# Patient Record
Sex: Female | Born: 1970 | Race: White | Hispanic: No | Marital: Married | Smoking: Never smoker
Health system: Southern US, Community
[De-identification: ages and names within clinical notes are randomized; demographics above are authoritative.]

## PROBLEM LIST (undated history)

## (undated) DIAGNOSIS — M1990 Unspecified osteoarthritis, unspecified site: Secondary | ICD-10-CM

## (undated) DIAGNOSIS — Z973 Presence of spectacles and contact lenses: Secondary | ICD-10-CM

---

## 1993-06-05 DIAGNOSIS — B2790 Infectious mononucleosis, unspecified without complication: Secondary | ICD-10-CM

## 1993-06-05 HISTORY — DX: Infectious mononucleosis, unspecified without complication: B27.90

## 2016-06-05 HISTORY — PX: ORIF FIBULA FRACTURE: SHX5114

## 2019-02-26 ENCOUNTER — Ambulatory Visit (INDEPENDENT_AMBULATORY_CARE_PROVIDER_SITE_OTHER): Payer: 59 | Admitting: Podiatry

## 2019-02-26 ENCOUNTER — Other Ambulatory Visit: Payer: Self-pay

## 2019-02-26 ENCOUNTER — Ambulatory Visit (INDEPENDENT_AMBULATORY_CARE_PROVIDER_SITE_OTHER): Payer: 59

## 2019-02-26 ENCOUNTER — Other Ambulatory Visit: Payer: Self-pay | Admitting: Podiatry

## 2019-02-26 ENCOUNTER — Encounter: Payer: Self-pay | Admitting: Podiatry

## 2019-02-26 VITALS — BP 102/86

## 2019-02-26 DIAGNOSIS — M84375A Stress fracture, left foot, initial encounter for fracture: Secondary | ICD-10-CM | POA: Diagnosis not present

## 2019-02-26 DIAGNOSIS — M79672 Pain in left foot: Secondary | ICD-10-CM

## 2019-02-26 MED ORDER — DICLOFENAC SODIUM 75 MG PO TBEC: 75.0000 mg | DELAYED_RELEASE_TABLET | Freq: Two times a day (BID) | ORAL | 1 refills | Status: DC

## 2019-03-02 NOTE — Progress Notes (Signed)
   HPI: 48 y.o. female presenting today as a new patient with a chief complaint of aching left dorsal foot pain that began 4-5 months ago. She states she has had a fracture in the area in the past. Applying pressure to the foot increases the pain. She has not had any treatment for her symptoms. Patient is here for further evaluation and treatment.   No past medical history on file.   Physical Exam: General: The patient is alert and oriented x3 in no acute distress.  Dermatology: Skin is warm, dry and supple bilateral lower extremities. Negative for open lesions or macerations.  Vascular: Palpable pedal pulses bilaterally. No edema or erythema noted. Capillary refill within normal limits.  Neurological: Epicritic and protective threshold grossly intact bilaterally.   Musculoskeletal Exam: Pain with palpation noted to the third metatarsal of the left foot. Range of motion within normal limits to all pedal and ankle joints bilateral. Muscle strength 5/5 in all groups bilateral.   Radiographic Exam:  Stress fracture noted to the third metatarsal of the left foot.     Assessment: 1. Stress fracture 3rd metatarsal left    Plan of Care:  1. Patient evaluated. X-Rays reviewed.  2. CAM boot dispensed. Weightbearing for four weeks.  3. Prescription for Diclofenac provided to patient. 4. Return to clinic as needed.   Euthanasia vet.      Edrick Kins, DPM [facility]  Dr. Edrick Kins, DPM    [address],  [address]                Office [phone]  Fax [phone]

## 2019-08-07 ENCOUNTER — Other Ambulatory Visit: Payer: Self-pay | Admitting: Obstetrics and Gynecology

## 2019-08-07 DIAGNOSIS — Z1231 Encounter for screening mammogram for malignant neoplasm of breast: Secondary | ICD-10-CM

## 2019-08-13 ENCOUNTER — Ambulatory Visit
Admission: RE | Admit: 2019-08-13 | Discharge: 2019-08-13 | Disposition: A | Discharge status: Home / Self Care | Payer: 59 | Source: Ambulatory Visit | Attending: Obstetrics and Gynecology | Admitting: Obstetrics and Gynecology

## 2019-08-13 ENCOUNTER — Other Ambulatory Visit: Payer: Self-pay

## 2019-08-13 DIAGNOSIS — Z1231 Encounter for screening mammogram for malignant neoplasm of breast: Secondary | ICD-10-CM

## 2020-06-05 DIAGNOSIS — E559 Vitamin D deficiency, unspecified: Secondary | ICD-10-CM

## 2020-06-05 HISTORY — DX: Vitamin D deficiency, unspecified: E55.9

## 2020-11-09 ENCOUNTER — Other Ambulatory Visit: Payer: Self-pay | Admitting: Obstetrics and Gynecology

## 2020-11-09 DIAGNOSIS — Z1231 Encounter for screening mammogram for malignant neoplasm of breast: Secondary | ICD-10-CM

## 2020-12-01 ENCOUNTER — Ambulatory Visit
Admission: RE | Admit: 2020-12-01 | Discharge: 2020-12-01 | Disposition: A | Discharge status: Home / Self Care | Payer: BC Managed Care – PPO | Source: Ambulatory Visit | Attending: Obstetrics and Gynecology | Admitting: Obstetrics and Gynecology

## 2020-12-01 ENCOUNTER — Other Ambulatory Visit: Payer: Self-pay

## 2020-12-01 DIAGNOSIS — Z1231 Encounter for screening mammogram for malignant neoplasm of breast: Secondary | ICD-10-CM | POA: Diagnosis not present

## 2020-12-01 DIAGNOSIS — Z8742 Personal history of other diseases of the female genital tract: Secondary | ICD-10-CM | POA: Diagnosis not present

## 2020-12-01 DIAGNOSIS — Z01419 Encounter for gynecological examination (general) (routine) without abnormal findings: Secondary | ICD-10-CM | POA: Diagnosis not present

## 2020-12-08 DIAGNOSIS — N852 Hypertrophy of uterus: Secondary | ICD-10-CM | POA: Diagnosis not present

## 2021-05-25 DIAGNOSIS — Z0000 Encounter for general adult medical examination without abnormal findings: Secondary | ICD-10-CM | POA: Diagnosis not present

## 2021-05-25 DIAGNOSIS — R7301 Impaired fasting glucose: Secondary | ICD-10-CM | POA: Diagnosis not present

## 2021-05-25 DIAGNOSIS — R0789 Other chest pain: Secondary | ICD-10-CM | POA: Diagnosis not present

## 2021-05-25 DIAGNOSIS — E559 Vitamin D deficiency, unspecified: Secondary | ICD-10-CM | POA: Diagnosis not present

## 2021-05-25 DIAGNOSIS — Z13220 Encounter for screening for lipoid disorders: Secondary | ICD-10-CM | POA: Diagnosis not present

## 2021-05-25 DIAGNOSIS — Z130 Encounter for screening for diseases of the blood and blood-forming organs and certain disorders involving the immune mechanism: Secondary | ICD-10-CM | POA: Diagnosis not present

## 2021-05-25 DIAGNOSIS — R002 Palpitations: Secondary | ICD-10-CM | POA: Diagnosis not present

## 2021-06-01 DIAGNOSIS — R7301 Impaired fasting glucose: Secondary | ICD-10-CM | POA: Diagnosis not present

## 2021-06-01 DIAGNOSIS — Z130 Encounter for screening for diseases of the blood and blood-forming organs and certain disorders involving the immune mechanism: Secondary | ICD-10-CM | POA: Diagnosis not present

## 2021-06-01 DIAGNOSIS — E559 Vitamin D deficiency, unspecified: Secondary | ICD-10-CM | POA: Diagnosis not present

## 2021-06-01 DIAGNOSIS — Z13220 Encounter for screening for lipoid disorders: Secondary | ICD-10-CM | POA: Diagnosis not present

## 2021-06-01 DIAGNOSIS — A084 Viral intestinal infection, unspecified: Secondary | ICD-10-CM | POA: Diagnosis not present

## 2021-06-01 DIAGNOSIS — R197 Diarrhea, unspecified: Secondary | ICD-10-CM | POA: Diagnosis not present

## 2021-06-01 DIAGNOSIS — R7303 Prediabetes: Secondary | ICD-10-CM

## 2021-06-01 HISTORY — DX: Prediabetes: R73.03

## 2021-06-05 DIAGNOSIS — R002 Palpitations: Secondary | ICD-10-CM

## 2021-06-05 HISTORY — DX: Palpitations: R00.2

## 2021-06-09 IMAGING — MG DIGITAL SCREENING BILAT W/ TOMO W/ CAD
6 of 10 series · 6 of 30 positions shown · non-contrast
Comparison: None.

CLINICAL DATA: Screening.

EXAM:
DIGITAL SCREENING BILATERAL MAMMOGRAM WITH TOMO AND CAD

[L CC synth-2D]
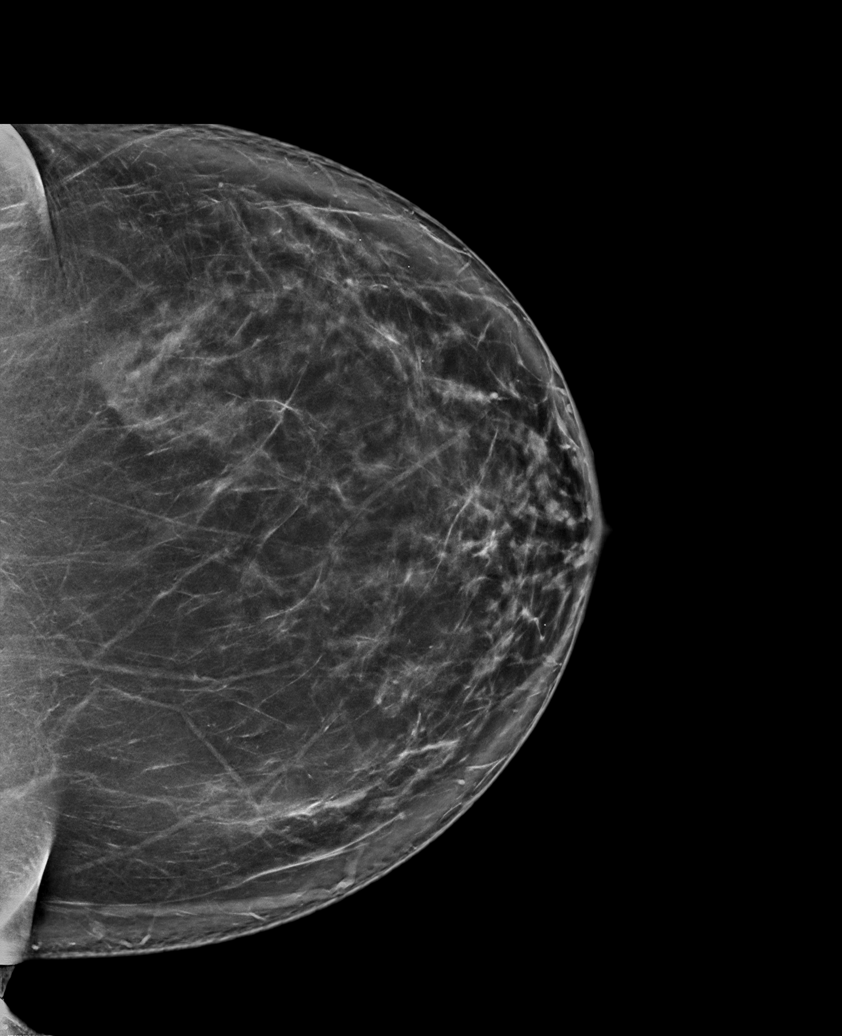

[R MLO synth-2D (1 of 2)]
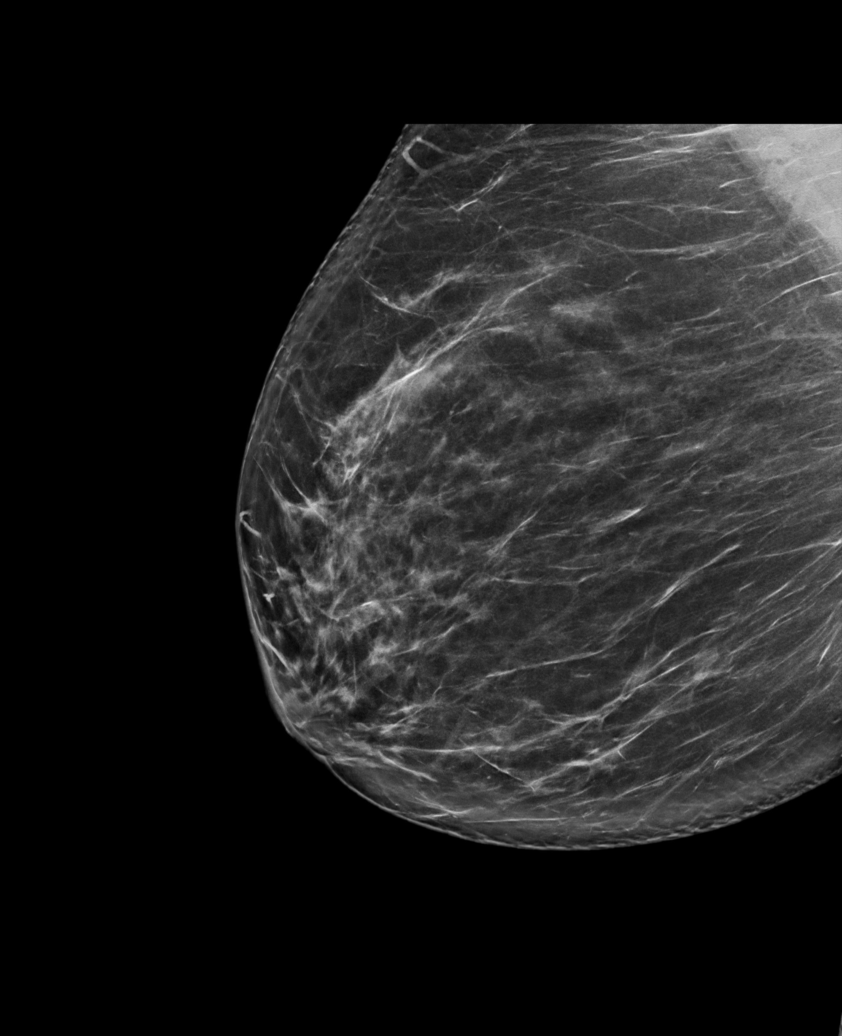

[L MLO synth-2D]
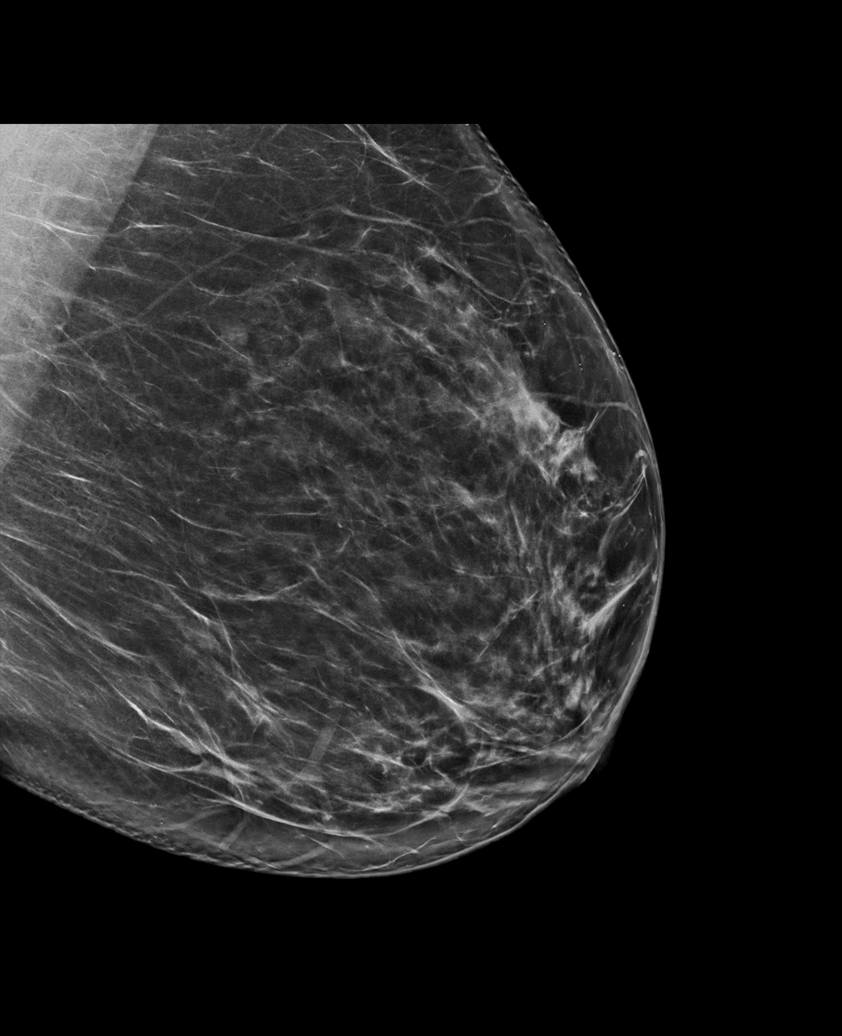

[R CC synth-2D]
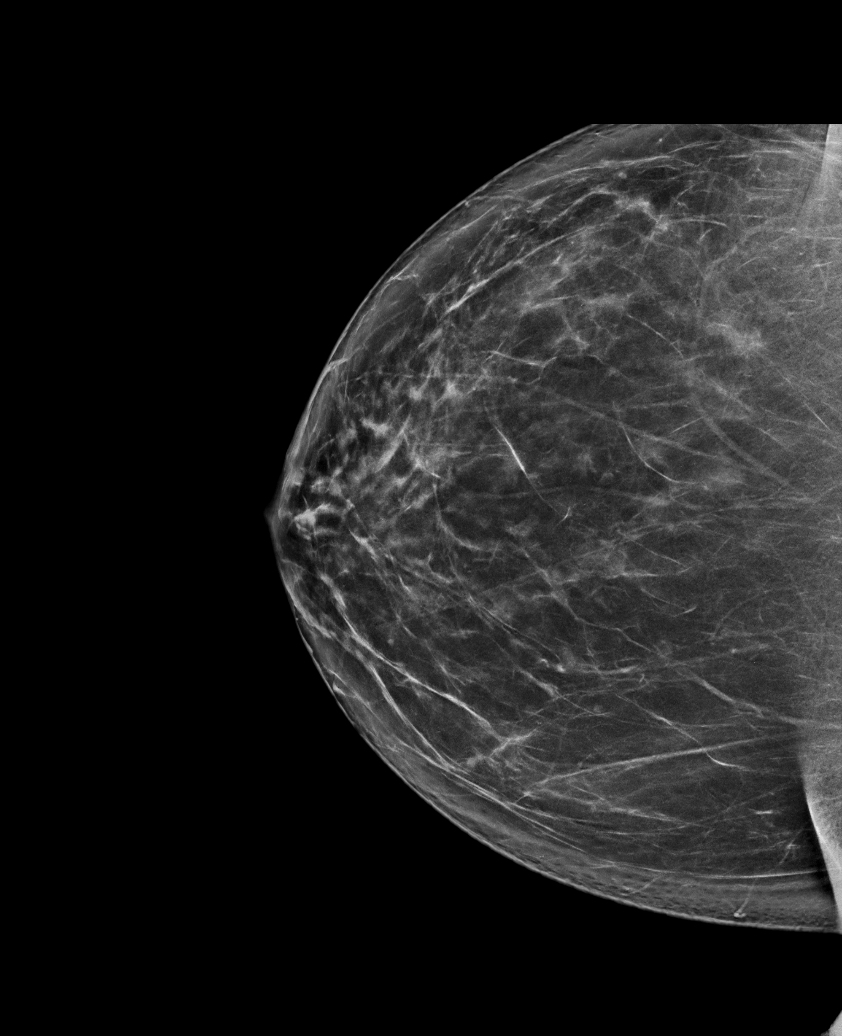

[R MLO synth-2D (2 of 2)]
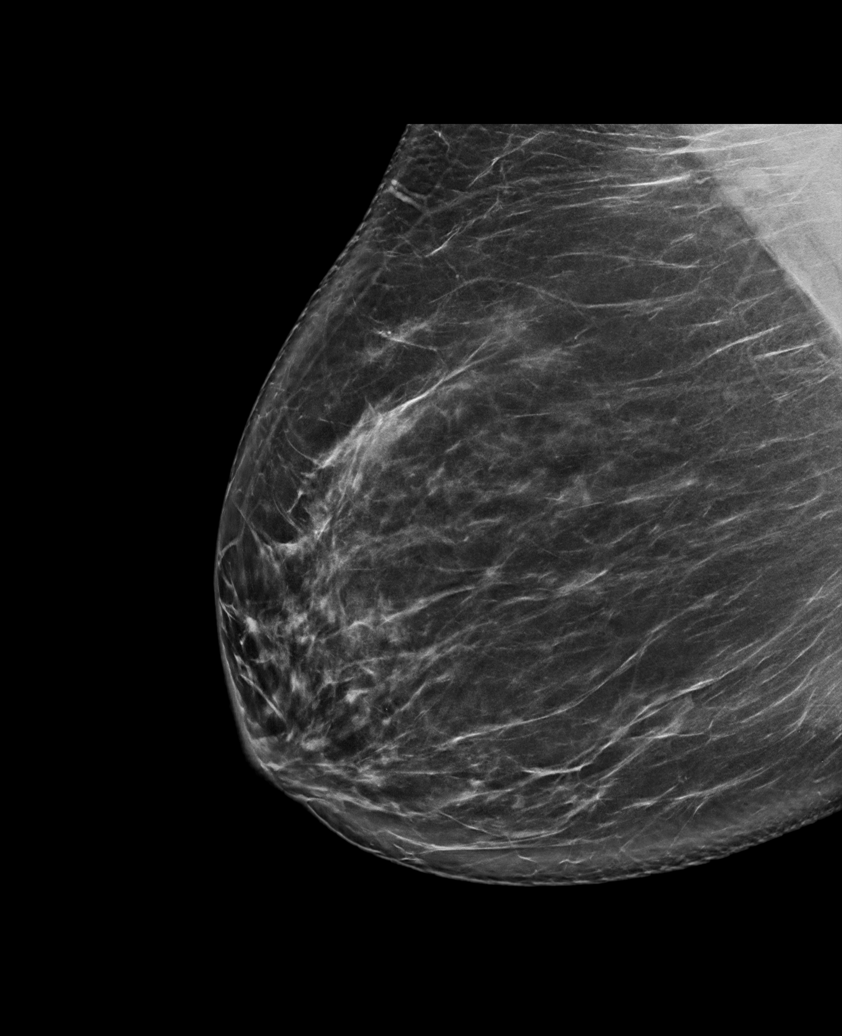

[L MLO tomo · tomo slice 45/90.0]
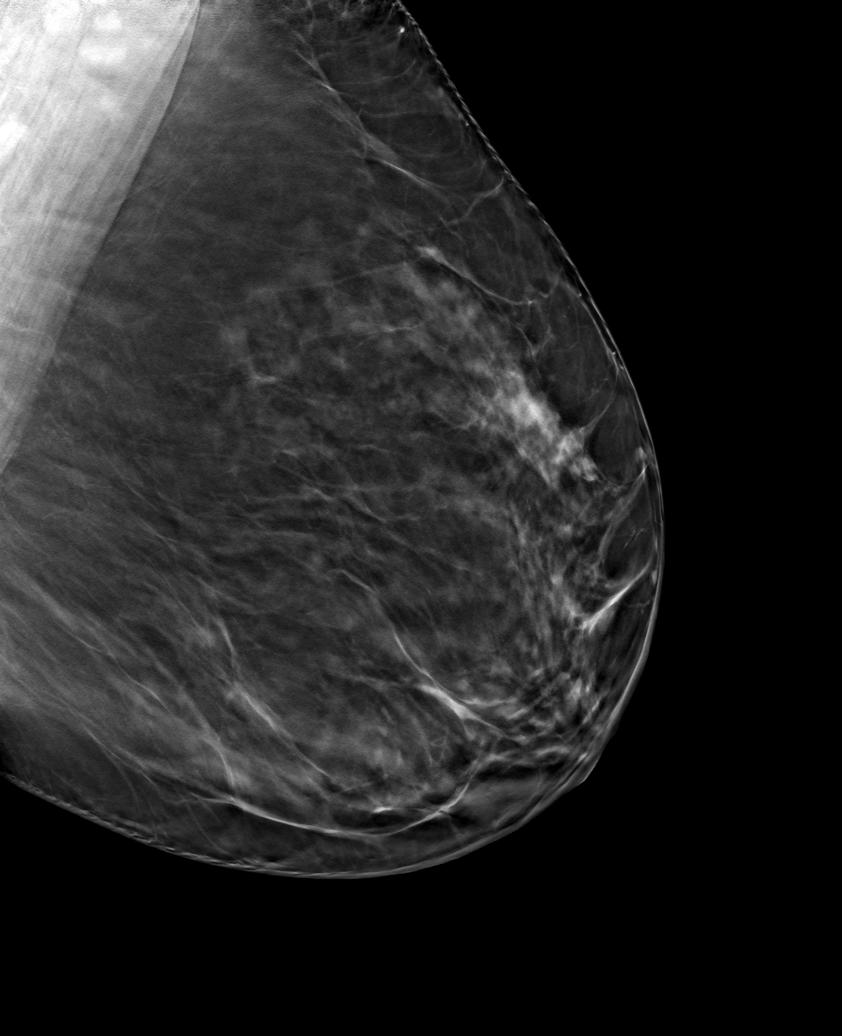

[6 of 30 positions shown; findings below may reference images not displayed]

ACR Breast Density Category b: There are scattered areas of
fibroglandular density.
FINDINGS: There are no findings suspicious for malignancy. Images were
processed with CAD.
IMPRESSION: No mammographic evidence of malignancy. A result letter of this
screening mammogram will be mailed directly to the patient.

RECOMMENDATION:
Screening mammogram in one year. (Code:[id number]-EJ6)

BI-RADS CATEGORY  1: Negative.

## 2021-06-22 DIAGNOSIS — R002 Palpitations: Secondary | ICD-10-CM | POA: Diagnosis not present

## 2021-06-22 DIAGNOSIS — R0602 Shortness of breath: Secondary | ICD-10-CM | POA: Diagnosis not present

## 2021-08-02 DIAGNOSIS — R002 Palpitations: Secondary | ICD-10-CM | POA: Diagnosis not present

## 2021-08-02 DIAGNOSIS — R0602 Shortness of breath: Secondary | ICD-10-CM | POA: Diagnosis not present

## 2021-08-17 DIAGNOSIS — E559 Vitamin D deficiency, unspecified: Secondary | ICD-10-CM | POA: Diagnosis not present

## 2021-12-08 ENCOUNTER — Other Ambulatory Visit: Payer: Self-pay | Admitting: Obstetrics and Gynecology

## 2021-12-08 DIAGNOSIS — Z1231 Encounter for screening mammogram for malignant neoplasm of breast: Secondary | ICD-10-CM

## 2021-12-14 ENCOUNTER — Ambulatory Visit
Admission: RE | Admit: 2021-12-14 | Discharge: 2021-12-14 | Disposition: A | Discharge status: Home / Self Care | Payer: BC Managed Care – PPO | Source: Ambulatory Visit | Attending: Obstetrics and Gynecology | Admitting: Obstetrics and Gynecology

## 2021-12-14 ENCOUNTER — Other Ambulatory Visit (HOSPITAL_COMMUNITY)
Admission: RE | Admit: 2021-12-14 | Discharge: 2021-12-14 | Disposition: A | Discharge status: Home / Self Care | Payer: BC Managed Care – PPO | Source: Ambulatory Visit | Attending: Nurse Practitioner | Admitting: Nurse Practitioner

## 2021-12-14 DIAGNOSIS — Z124 Encounter for screening for malignant neoplasm of cervix: Secondary | ICD-10-CM | POA: Diagnosis not present

## 2021-12-14 DIAGNOSIS — Z1231 Encounter for screening mammogram for malignant neoplasm of breast: Secondary | ICD-10-CM

## 2021-12-14 DIAGNOSIS — Z01419 Encounter for gynecological examination (general) (routine) without abnormal findings: Secondary | ICD-10-CM | POA: Diagnosis not present

## 2021-12-16 LAB — CYTOLOGY - PAP
Comment: NEGATIVE
Diagnosis: NEGATIVE
High risk HPV: NEGATIVE

## 2021-12-21 DIAGNOSIS — D259 Leiomyoma of uterus, unspecified: Secondary | ICD-10-CM | POA: Diagnosis not present

## 2022-01-16 DIAGNOSIS — N921 Excessive and frequent menstruation with irregular cycle: Secondary | ICD-10-CM | POA: Diagnosis not present

## 2022-01-16 DIAGNOSIS — D259 Leiomyoma of uterus, unspecified: Secondary | ICD-10-CM | POA: Diagnosis not present

## 2022-02-01 DIAGNOSIS — N921 Excessive and frequent menstruation with irregular cycle: Secondary | ICD-10-CM | POA: Diagnosis not present

## 2022-02-01 DIAGNOSIS — Z3202 Encounter for pregnancy test, result negative: Secondary | ICD-10-CM | POA: Diagnosis not present

## 2022-03-25 DIAGNOSIS — L03115 Cellulitis of right lower limb: Secondary | ICD-10-CM | POA: Diagnosis not present

## 2022-04-12 DIAGNOSIS — M25562 Pain in left knee: Secondary | ICD-10-CM | POA: Diagnosis not present

## 2022-04-26 DIAGNOSIS — M25562 Pain in left knee: Secondary | ICD-10-CM | POA: Diagnosis not present

## 2022-05-03 DIAGNOSIS — N921 Excessive and frequent menstruation with irregular cycle: Secondary | ICD-10-CM | POA: Diagnosis not present

## 2022-05-03 DIAGNOSIS — D259 Leiomyoma of uterus, unspecified: Secondary | ICD-10-CM | POA: Diagnosis not present

## 2022-05-03 DIAGNOSIS — M25562 Pain in left knee: Secondary | ICD-10-CM | POA: Diagnosis not present

## 2022-05-10 DIAGNOSIS — N921 Excessive and frequent menstruation with irregular cycle: Secondary | ICD-10-CM | POA: Diagnosis not present

## 2022-05-10 DIAGNOSIS — D219 Benign neoplasm of connective and other soft tissue, unspecified: Secondary | ICD-10-CM | POA: Diagnosis not present

## 2022-05-16 ENCOUNTER — Encounter (HOSPITAL_BASED_OUTPATIENT_CLINIC_OR_DEPARTMENT_OTHER): Payer: Self-pay | Admitting: Obstetrics and Gynecology

## 2022-05-17 ENCOUNTER — Encounter (HOSPITAL_BASED_OUTPATIENT_CLINIC_OR_DEPARTMENT_OTHER): Payer: Self-pay | Admitting: Obstetrics and Gynecology

## 2022-05-17 ENCOUNTER — Other Ambulatory Visit: Payer: Self-pay

## 2022-05-17 ENCOUNTER — Encounter (HOSPITAL_COMMUNITY)
Admission: RE | Admit: 2022-05-17 | Discharge: 2022-05-17 | Disposition: A | Discharge status: Home / Self Care | Payer: BC Managed Care – PPO | Source: Ambulatory Visit | Attending: Obstetrics and Gynecology | Admitting: Obstetrics and Gynecology

## 2022-05-17 DIAGNOSIS — Z01812 Encounter for preprocedural laboratory examination: Secondary | ICD-10-CM | POA: Diagnosis not present

## 2022-05-17 DIAGNOSIS — Z01818 Encounter for other preprocedural examination: Secondary | ICD-10-CM

## 2022-05-17 LAB — CBC
HCT: 45.4 % (ref 36.0–46.0)
Hemoglobin: 15.3 g/dL — ABNORMAL HIGH (ref 12.0–15.0)
MCH: 30.7 pg (ref 26.0–34.0)
MCHC: 33.7 g/dL (ref 30.0–36.0)
MCV: 91.2 fL (ref 80.0–100.0)
Platelets: 200 10*3/uL (ref 150–400)
RBC: 4.98 MIL/uL (ref 3.87–5.11)
RDW: 13.2 % (ref 11.5–15.5)
WBC: 5.7 10*3/uL (ref 4.0–10.5)
nRBC: 0 % (ref 0.0–0.2)

## 2022-05-17 NOTE — Progress Notes (Signed)
Your procedure is scheduled on Tuesday, 05/23/22.  Report to [facility]. M.   Call this number if you have problems the morning of surgery  :[phone].   OUR ADDRESS IS [address].  WE ARE LOCATED IN [facility].  PLEASE BRING YOUR INSURANCE CARD AND PHOTO ID DAY OF SURGERY.  ONLY 2 PEOPLE ARE ALLOWED IN  WAITING  ROOM.                                      REMEMBER:  DO NOT EAT FOOD, CANDY GUM OR MINTS  AFTER MIDNIGHT THE NIGHT BEFORE YOUR SURGERY . YOU MAY HAVE CLEAR LIQUIDS FROM MIDNIGHT THE NIGHT BEFORE YOUR SURGERY UNTIL  7:15 AM. NO CLEAR LIQUIDS AFTER   7:15 AM DAY OF SURGERY.  YOU MAY  BRUSH YOUR TEETH MORNING OF SURGERY AND RINSE YOUR MOUTH OUT, NO CHEWING GUM CANDY OR MINTS.     CLEAR LIQUID DIET   Foods Allowed                                                                     Foods Excluded  Coffee and tea, regular and decaf                             liquids that you cannot  Plain Jell-O                                                                   see through such as: Fruit ices (not with fruit pulp)                                     milk, soups, orange juice  Plain  Popsicles                                    All solid food Carbonated beverages, regular and diet                                    Cranberry, grape and apple juices Sports drinks like Gatorade _____________________________________________________________________     TAKE ONLY THESE MEDICATIONS MORNING OF SURGERY: Please ask your surgeon if you should take Myfembree on day of surgery. Do not take anything else (including vitamins) on morning of surgery.    UP TO 4 VISITORS  MAY VISIT IN THE EXTENDED RECOVERY ROOM UNTIL 800 PM ONLY.  ONE  VISITOR AGE 49 AND OVER MAY SPEND THE NIGHT AND MUST BE IN EXTENDED RECOVERY ROOM NO LATER THAN 800 PM . YOUR DISCHARGE TIME AFTER YOU SPEND THE  NIGHT IS 900 AM THE MORNING AFTER YOUR  SURGERY.  YOU MAY PACK A SMALL OVERNIGHT BAG WITH TOILETRIES FOR YOUR OVERNIGHT STAY IF YOU WISH.  YOUR PRESCRIPTION MEDICATIONS WILL BE PROVIDED DURING [place].                                      DO NOT WEAR JEWERLY, MAKE UP. DO NOT WEAR LOTIONS, POWDERS, PERFUMES OR NAIL POLISH ON YOUR FINGERNAILS. TOENAIL POLISH IS OK TO WEAR. DO NOT SHAVE FOR 48 HOURS PRIOR TO DAY OF SURGERY. MEN MAY SHAVE FACE AND NECK. CONTACTS, GLASSES, OR DENTURES MAY NOT BE WORN TO SURGERY.  REMEMBER: NO SMOKING, DRUGS OR ALCOHOL FOR 24 HOURS BEFORE YOUR SURGERY.                                    [place] IS NOT RESPONSIBLE  FOR ANY BELONGINGS.                                                                    Marland Kitchen           [facility] - Preparing for Surgery Before surgery, you can play an important role.  Because skin is not sterile, your skin needs to be as free of germs as possible.  You can reduce the number of germs on your skin by washing with CHG (chlorahexidine gluconate) soap before surgery.  CHG is an antiseptic cleaner which kills germs and bonds with the skin to continue killing germs even after washing. Please DO NOT use if you have an allergy to CHG or antibacterial soaps.  If your skin becomes reddened/irritated stop using the CHG and inform your nurse when you arrive at Short Stay. Do not shave (including legs and underarms) for at least 48 hours prior to the first CHG shower.  You may shave your face/neck. Please follow these instructions carefully:  1.  Shower with CHG Soap the night before surgery and the  morning of Surgery.  2.  If you choose to wash your hair, wash your hair first as usual with your  normal  shampoo.  3.  After you shampoo, rinse your hair and body thoroughly to remove the  shampoo.                                        4.  Use CHG as you would any other liquid soap.  You can apply chg directly  to the skin and wash , chg soap provided, night before and  morning of your surgery.  5.  Apply the CHG Soap to your body ONLY FROM THE NECK DOWN.   Do not use on face/ open                           Wound or open sores. Avoid contact with eyes, ears mouth and genitals (private parts).  Wash face,  Genitals (private parts) with your normal soap.             6.  Wash thoroughly, paying special attention to the area where your surgery  will be performed.  7.  Thoroughly rinse your body with warm water from the neck down.  8.  DO NOT shower/wash with your normal soap after using and rinsing off  the CHG Soap.             9.  Pat yourself dry with a clean towel.            10.  Wear clean pajamas.            11.  Place clean sheets on your bed the night of your first shower and do not  sleep with pets. Day of Surgery : Do not apply any lotions/deodorants the morning of surgery.  Please wear clean clothes to the hospital/surgery center.  IF YOU HAVE ANY SKIN IRRITATION OR PROBLEMS WITH THE SURGICAL SOAP, PLEASE GET A BAR OF GOLD DIAL SOAP AND SHOWER THE NIGHT BEFORE YOUR SURGERY AND THE MORNING OF YOUR SURGERY. PLEASE LET THE NURSE KNOW MORNING OF YOUR SURGERY IF YOU HAD ANY PROBLEMS WITH THE SURGICAL SOAP.   ________________________________________________________________________                                                        QUESTIONS [facility] PRE OP NURSE PHONE [phone].

## 2022-05-17 NOTE — Progress Notes (Signed)
Spoke w/ via phone for pre-op interview---[facility] needs dos---- urine pregnancy per anesthesia, surgeon orders pending as of 05/17/22             Lab results------05/17/22 lab appt for cbc, type & screen, 08/02/21 echocardiogram in [place] test -----patient states asymptomatic no test needed Arrive at -------0815 on Tuesday, 05/23/22 NPO after MN NO Solid Food.  Clear liquids from MN until---0715 Med rec completed Medications to take morning of surgery -----Instructed patient to ask surgeon if she should take Myfemree on day of surgery. Diabetic medication -----n/a Patient instructed no nail polish to be worn day of surgery Patient instructed to bring photo id and insurance card day of surgery Patient aware to have Driver (ride ) / caregiver    for 24 hours after surgery - husband, Dellis Filbert  Patient Special Instructions -----Extended / overnight stay instructions given. Pre-Op special Istructions -----Requested orders from Dr. Landry Mellow via [place] on 05/16/22. Patient verbalized understanding of instructions that were given at this phone interview. Patient denies shortness of breath, chest pain, fever, cough at this phone interview.

## 2022-05-22 ENCOUNTER — Other Ambulatory Visit: Payer: Self-pay | Admitting: Obstetrics and Gynecology

## 2022-05-22 DIAGNOSIS — N921 Excessive and frequent menstruation with irregular cycle: Secondary | ICD-10-CM

## 2022-05-22 NOTE — H&P (Signed)
  The note originally documented on this encounter has been moved the the encounter in which it belongs.  

## 2022-05-22 NOTE — H&P (Signed)
[facility] for Appointment 1. Preop History of Present Illness General:         51 y/o presents for preop visit. Pt is schedule for a robotic assisted laparoscopic hysterectomy with bilateral salpingoophorectomy on 05/23/2022 for the management of fibroids and menorrhagia.        Her ultrasound on 05/03/2022 reveals a uterus that is 17.3 cm x 9.8 cm x 13.2 cm. one large fibroid is noted 12 cm. It has decreased by 1 cm since her last ultrasound.        she had a benign endometrial biopsy 02/01/2022.         she started Myfembree on 01/17/2022. she is planning on definitive therapy via robotic assisted laparoscopic hysterectomy with bilateral salpingo-oophorectomy.. she desires to have the ovaries removed due to family history of ovarian cancer.  Current Medications Taking Myfembree(Relugolix-Estradiol-Norethind) 40-1-0.5 MG Tablet 1 tablet Orally Once a day Vitamin D3 50 MCG (2000 UT) Capsule 1 capsule Orally Once a day Medication List reviewed and reconciled with the patient Past Medical History      Medical History Verified. Surgical History       broken left ankle 06/2016 Family History Father: alive 52 yrs, hypercholesterolemia, colon polyps Mother: alive 66 yrs, palpatations Brother 67: alive 62 yrs, hypercholesterolemia Sister 106: alive 63 yrs, osteoporosis Paternal aunt: deceased, diagnosed with Ovarian cancer Negative family history of colon cancer and liver disease No breast or cervical cancer. Paternal aunt dx w/Breast Cancer early 66's. Social History General:  Tobacco use  cigarettes:  Never smoked, Tobacco history last updated  05/10/2022, Vaping  No. Alcohol: yes, Rare once a month. Caffeine: yes, soda 1 x daily. Recreational drug use: no. Marital Status: married, Merry Proud. Children: none. OCCUPATION: [employer]et. Gyn History Sexual activity currently sexually active.  Periods : irregular.  LMP 01/2022.  Denies H/O Birth control.  Last pap smear date 12/2021 NILM, HRHPV neg.  Last  mammogram date 12/14/2021.  H/O Abnormal pap smear 11/2019 NILM, HRHPV+.  H/O STD HPV.  Menarche 30.  OB History Never been pregnant  per patient.  Allergies Opiods: Full body Rash - Allergy Sulfamethoxazole: Full body Rash - Allergy Hospitalization/Major Diagnostic Procedure Mono, Hepatits & allergic reaction - 5 days 1995 Review of Systems CONSTITUTIONAL:         Chills No.  Fatigue No.  Fever No.  Night sweats No.  Recent travel outside [place] No.  Sweats No.  Weight change No.     OPHTHALMOLOGY:         Blurring of vision no.  Change in vision no.  Double vision no.     ENT:         Dizziness no.  Nose bleeds no.  Sore throat no.  Teeth pain no.     ALLERGY:         Hives no.     CARDIOLOGY:         Chest pain no.  High blood pressure no.  Irregular heart beat no.  Leg edema no.  Palpitations no.     RESPIRATORY:         Shortness of breath no.  Cough no.  Wheezing no.     UROLOGY:         Pain with urination no.  Urinary urgency no.  Urinary frequency no.  Urinary incontinence no.  Difficulty urinating No.  Blood in urine No.     GASTROENTEROLOGY:         Abdominal pain no.  Appetite change no.  Bloating/belching no.  Blood in stool or on toilet paper no.  Change in bowel movements no.  Constipation no.  Diarrhea no.  Difficulty swallowing no.  Nausea no.     FEMALE REPRODUCTIVE:         Vulvar pain no.  Vulvar rash no.  Abnormal vaginal bleeding , yes.  Breast pain no.  Nipple discharge no.  Pain with intercourse no.  Pelvic pain no.  Unusual vaginal discharge no.  Vaginal itching no.     MUSCULOSKELETAL:         Muscle aches no.     NEUROLOGY:         Headache no.  Tingling/numbness no.  Weakness no.     PSYCHOLOGY:         Depression no.  Anxiety no.  Nervousness no.  Sleep disturbances no.  Suicidal ideation no .     ENDOCRINOLOGY:         Excessive thirst no.  Excessive urination no.  Hair loss no.  Heat or cold intolerance no.     HEMATOLOGY/LYMPH:         Abnormal  bleeding no.  Easy bruising no.  Swollen glands no.     DERMATOLOGY:         New/changing skin lesion no.  Rash no.  Sores no.   Vital Signs Wt: 294.4, Wt change: 0.8 lbs, Ht: 68.5, BMI: 44.11, Pulse sitting: 81, BP sitting: 132/79. Examination General Examination:        CONSTITUTIONAL: alert, oriented, NAD. SKIN:  moist, warm. EYES:  Conjunctiva clear. LUNGS: good I:E efffort noted, clear to auscultation bilaterally. HEART: regular rate and rhythm. ABDOMEN: soft, non-tender/non-distended, bowel sounds present. FEMALE GENITOURINARY: normal external genitalia, labia - unremarkable, vagina - pink moist mucosa, no lesions or abnormal discharge, cervix - no discharge or lesions or CMT, adnexa - no masses or tenderness, uterus - nontender 16-18 week size uterus. EXTREMITIES: no edema present. PSYCH:  affect normal, good eye contact.  Physical Examination Chaperone present:         Chaperone present  Beather Arbour 05/10/2022 11:18:37 AM >, for pelvic exam.      Assessments 1. Fibroids - D21.9 (Primary) 2. Menorrhagia with irregular cycle - N92.1 3. Family history of ovarian cancer - Z80.41 Treatment 1. Fibroids  Notes: Notes: r/b/a of robotic assisted laparoscopic hysterectomy with bilateral salpingo-oophorectomy discussed with the patient. including but not limited to infection/ bleeding / damage to bowel bladder ureters as well as conversion to laparotomy with the need for further surgery. pt voiced understanding and would like to proceed with robotic assisted laparoscopic hysterectomy with bilateral salpingectomy.. r/o trasnfusion discussed. HIV/ HEP B and C.. she is advised to avoid NSAIDS between now and surgery. she is advised not to eat or drink after midninght the night prior to surgery.Marland Kitchen she will not be able to drive for 1 week after surgery. she will need to avoid heavy lifting over 10 lbs and sex for at least 6 weeks after surgery    2. Menorrhagia with irregular cycle  Notes: Notes: r/b/a  of robotic assisted laparoscopic hysterectomy with bilateral salpingo-oophorectomy discussed with the patient. including but not limited to infection/ bleeding / damage to bowel bladder ureters as well as conversion to laparotomy with the need for further surgery. pt voiced understanding and would like to proceed with robotic assisted laparoscopic hysterectomy with bilateral salpingectomy.. r/o trasnfusion discussed. HIV/ HEP B and C.. she is advised to avoid NSAIDS between now and surgery. she  is advised not to eat or drink after midninght the night prior to surgery.Marland Kitchen she will not be able to drive for 1 week after surgery. she will need to avoid heavy lifting over 10 lbs and sex for at least 6 weeks after surgery    3. Family history of ovarian cancer  Notes: Notes: r/b/a of robotic assisted laparoscopic hysterectomy with bilateral salpingo-oophorectomy discussed with the patient. including but not limited to infection/ bleeding / damage to bowel bladder ureters as well as conversion to laparotomy with the need for further surgery. pt voiced understanding and would like to proceed with robotic assisted laparoscopic hysterectomy with bilateral salpingectomy.. r/o trasnfusion discussed. HIV/ HEP B and C.. she is advised to avoid NSAIDS between now and surgery. she is advised not to eat or drink after midninght the night prior to surgery.Marland Kitchen she will not be able to drive for 1 week after surgery. she will need to avoid heavy lifting over 10 lbs and sex for at least 6 weeks after surgery

## 2022-05-23 ENCOUNTER — Other Ambulatory Visit: Payer: Self-pay

## 2022-05-23 ENCOUNTER — Encounter (HOSPITAL_BASED_OUTPATIENT_CLINIC_OR_DEPARTMENT_OTHER)
Admission: RE | Disposition: A | Discharge status: Home / Self Care | Payer: Self-pay | Source: Ambulatory Visit | Attending: Obstetrics and Gynecology

## 2022-05-23 ENCOUNTER — Ambulatory Visit (HOSPITAL_BASED_OUTPATIENT_CLINIC_OR_DEPARTMENT_OTHER): Payer: BC Managed Care – PPO | Admitting: Anesthesiology

## 2022-05-23 ENCOUNTER — Encounter (HOSPITAL_BASED_OUTPATIENT_CLINIC_OR_DEPARTMENT_OTHER): Payer: Self-pay | Admitting: Obstetrics and Gynecology

## 2022-05-23 ENCOUNTER — Observation Stay (HOSPITAL_BASED_OUTPATIENT_CLINIC_OR_DEPARTMENT_OTHER)
Admission: RE | Admit: 2022-05-23 | Discharge: 2022-05-23 | Disposition: A | Discharge status: Home / Self Care | Payer: BC Managed Care – PPO | Source: Ambulatory Visit | Attending: Obstetrics and Gynecology | Admitting: Obstetrics and Gynecology

## 2022-05-23 DIAGNOSIS — D259 Leiomyoma of uterus, unspecified: Secondary | ICD-10-CM | POA: Diagnosis not present

## 2022-05-23 DIAGNOSIS — N888 Other specified noninflammatory disorders of cervix uteri: Secondary | ICD-10-CM | POA: Diagnosis not present

## 2022-05-23 DIAGNOSIS — N921 Excessive and frequent menstruation with irregular cycle: Secondary | ICD-10-CM | POA: Diagnosis not present

## 2022-05-23 DIAGNOSIS — Z01818 Encounter for other preprocedural examination: Secondary | ICD-10-CM

## 2022-05-23 DIAGNOSIS — N920 Excessive and frequent menstruation with regular cycle: Secondary | ICD-10-CM | POA: Diagnosis present

## 2022-05-23 DIAGNOSIS — Z8041 Family history of malignant neoplasm of ovary: Secondary | ICD-10-CM | POA: Diagnosis not present

## 2022-05-23 HISTORY — DX: Unspecified osteoarthritis, unspecified site: M19.90

## 2022-05-23 HISTORY — DX: Presence of spectacles and contact lenses: Z97.3

## 2022-05-23 HISTORY — PX: ROBOTIC ASSISTED LAPAROSCOPIC HYSTERECTOMY AND SALPINGECTOMY: SHX6379

## 2022-05-23 LAB — POCT PREGNANCY, URINE: Preg Test, Ur: NEGATIVE

## 2022-05-23 LAB — TYPE AND SCREEN
ABO/RH(D): A POS
Antibody Screen: NEGATIVE

## 2022-05-23 LAB — ABO/RH: ABO/RH(D): A POS

## 2022-05-23 SURGERY — XI ROBOTIC ASSISTED LAPAROSCOPIC HYSTERECTOMY AND SALPINGECTOMY
Anesthesia: General | Site: Abdomen | Laterality: Bilateral

## 2022-05-23 MED ORDER — IBUPROFEN 200 MG PO TABS
ORAL_TABLET | ORAL | Status: AC
  Filled 2022-05-23: qty 3

## 2022-05-23 MED ORDER — OXYCODONE HCL 5 MG/5ML PO SOLN: 5.0000 mg | Freq: Once | ORAL | Status: DC | PRN

## 2022-05-23 MED ORDER — OXYCODONE HCL 5 MG PO TABS: 5.0000 mg | ORAL_TABLET | Freq: Once | ORAL | Status: DC | PRN

## 2022-05-23 MED ORDER — KETOROLAC TROMETHAMINE 30 MG/ML IJ SOLN
INTRAMUSCULAR | Status: AC
  Filled 2022-05-23: qty 1

## 2022-05-23 MED ORDER — AMISULPRIDE (ANTIEMETIC) 5 MG/2ML IV SOLN
10.0000 mg | Freq: Once | INTRAVENOUS | Status: AC
  Administered 2022-05-23: 10 mg via INTRAVENOUS

## 2022-05-23 MED ORDER — FENTANYL CITRATE (PF) 100 MCG/2ML IJ SOLN
INTRAMUSCULAR | Status: DC | PRN
  Administered 2022-05-23: 25 ug via INTRAVENOUS
  Administered 2022-05-23: 50 ug via INTRAVENOUS
  Administered 2022-05-23: 100 ug via INTRAVENOUS
  Administered 2022-05-23: 50 ug via INTRAVENOUS
  Administered 2022-05-23: 25 ug via INTRAVENOUS

## 2022-05-23 MED ORDER — LACTATED RINGERS IV SOLN: INTRAVENOUS | Status: DC

## 2022-05-23 MED ORDER — ACETAMINOPHEN 500 MG PO TABS: 1000.0000 mg | ORAL_TABLET | Freq: Once | ORAL | Status: DC

## 2022-05-23 MED ORDER — ACETAMINOPHEN 500 MG PO TABS
1000.0000 mg | ORAL_TABLET | ORAL | Status: AC
  Administered 2022-05-23: 1000 mg via ORAL

## 2022-05-23 MED ORDER — FENTANYL CITRATE (PF) 250 MCG/5ML IJ SOLN
INTRAMUSCULAR | Status: AC
  Filled 2022-05-23: qty 5

## 2022-05-23 MED ORDER — LIDOCAINE HCL (PF) 2 % IJ SOLN
INTRAMUSCULAR | Status: AC
  Filled 2022-05-23: qty 5

## 2022-05-23 MED ORDER — ACETAMINOPHEN 500 MG PO TABS
ORAL_TABLET | ORAL | Status: AC
  Filled 2022-05-23: qty 2

## 2022-05-23 MED ORDER — PROPOFOL 10 MG/ML IV BOLUS
INTRAVENOUS | Status: DC | PRN
  Administered 2022-05-23: 170 mg via INTRAVENOUS

## 2022-05-23 MED ORDER — ACETAMINOPHEN 500 MG PO TABS
1000.0000 mg | ORAL_TABLET | Freq: Four times a day (QID) | ORAL | Status: DC
  Administered 2022-05-23: 1000 mg via ORAL

## 2022-05-23 MED ORDER — HYDROMORPHONE HCL 2 MG PO TABS
ORAL_TABLET | ORAL | Status: AC
  Filled 2022-05-23: qty 1

## 2022-05-23 MED ORDER — SODIUM CHLORIDE 0.9 % IR SOLN
Status: DC | PRN
  Administered 2022-05-23: 1000 mL

## 2022-05-23 MED ORDER — FENTANYL CITRATE (PF) 100 MCG/2ML IJ SOLN: 25.0000 ug | INTRAMUSCULAR | Status: DC | PRN

## 2022-05-23 MED ORDER — SCOPOLAMINE 1 MG/3DAYS TD PT72: 1.0000 | MEDICATED_PATCH | TRANSDERMAL | Status: DC

## 2022-05-23 MED ORDER — PHENYLEPHRINE 80 MCG/ML (10ML) SYRINGE FOR IV PUSH (FOR BLOOD PRESSURE SUPPORT)
PREFILLED_SYRINGE | INTRAVENOUS | Status: DC | PRN
  Administered 2022-05-23: 160 ug via INTRAVENOUS

## 2022-05-23 MED ORDER — PHENYLEPHRINE 80 MCG/ML (10ML) SYRINGE FOR IV PUSH (FOR BLOOD PRESSURE SUPPORT)
PREFILLED_SYRINGE | INTRAVENOUS | Status: AC
  Filled 2022-05-23: qty 10

## 2022-05-23 MED ORDER — MIDAZOLAM HCL 5 MG/5ML IJ SOLN
INTRAMUSCULAR | Status: DC | PRN
  Administered 2022-05-23: 2 mg via INTRAVENOUS

## 2022-05-23 MED ORDER — 0.9 % SODIUM CHLORIDE (POUR BTL) OPTIME
TOPICAL | Status: DC | PRN
  Administered 2022-05-23: 500 mL

## 2022-05-23 MED ORDER — OXYCODONE HCL 5 MG PO TABS: 5.0000 mg | ORAL_TABLET | ORAL | Status: DC | PRN

## 2022-05-23 MED ORDER — GABAPENTIN 300 MG PO CAPS
ORAL_CAPSULE | ORAL | Status: AC
  Filled 2022-05-23: qty 1

## 2022-05-23 MED ORDER — SODIUM CHLORIDE (PF) 0.9 % IJ SOLN
INTRAMUSCULAR | Status: AC
  Filled 2022-05-23: qty 10

## 2022-05-23 MED ORDER — KETOROLAC TROMETHAMINE 30 MG/ML IJ SOLN: 30.0000 mg | Freq: Once | INTRAMUSCULAR | Status: DC

## 2022-05-23 MED ORDER — DEXAMETHASONE SODIUM PHOSPHATE 10 MG/ML IJ SOLN
INTRAMUSCULAR | Status: DC | PRN
  Administered 2022-05-23: 10 mg via INTRAVENOUS

## 2022-05-23 MED ORDER — SODIUM CHLORIDE 0.9 % IV SOLN
INTRAVENOUS | Status: DC | PRN
  Administered 2022-05-23: 110 mL
  Administered 2022-05-23: 10 mL

## 2022-05-23 MED ORDER — PANTOPRAZOLE SODIUM 40 MG PO TBEC
40.0000 mg | DELAYED_RELEASE_TABLET | Freq: Every day | ORAL | Status: DC
  Administered 2022-05-23: 40 mg via ORAL

## 2022-05-23 MED ORDER — ACETAMINOPHEN 500 MG PO TABS: 1000.0000 mg | ORAL_TABLET | Freq: Three times a day (TID) | ORAL | 0 refills | Status: AC | PRN

## 2022-05-23 MED ORDER — ONDANSETRON HCL 4 MG PO TABS: 4.0000 mg | ORAL_TABLET | Freq: Four times a day (QID) | ORAL | Status: DC | PRN

## 2022-05-23 MED ORDER — SENNA 8.6 MG PO TABS
ORAL_TABLET | ORAL | Status: AC
  Filled 2022-05-23: qty 1

## 2022-05-23 MED ORDER — HEMOSTATIC AGENTS (NO CHARGE) OPTIME
TOPICAL | Status: DC | PRN
  Administered 2022-05-23: 1

## 2022-05-23 MED ORDER — SODIUM CHLORIDE 0.9 % IV SOLN
INTRAVENOUS | Status: AC
  Filled 2022-05-23: qty 2

## 2022-05-23 MED ORDER — MIDAZOLAM HCL 2 MG/2ML IJ SOLN
INTRAMUSCULAR | Status: AC
  Filled 2022-05-23: qty 2

## 2022-05-23 MED ORDER — POVIDONE-IODINE 10 % EX SWAB: 2.0000 | Freq: Once | CUTANEOUS | Status: DC

## 2022-05-23 MED ORDER — FENTANYL CITRATE (PF) 100 MCG/2ML IJ SOLN: 50.0000 ug | INTRAMUSCULAR | Status: DC | PRN

## 2022-05-23 MED ORDER — SODIUM CHLORIDE 0.9 % IV SOLN
2.0000 g | INTRAVENOUS | Status: AC
  Administered 2022-05-23: 2 g via INTRAVENOUS

## 2022-05-23 MED ORDER — AMISULPRIDE (ANTIEMETIC) 5 MG/2ML IV SOLN
INTRAVENOUS | Status: AC
  Filled 2022-05-23: qty 2

## 2022-05-23 MED ORDER — HYDROMORPHONE HCL 2 MG PO TABS: 2.0000 mg | ORAL_TABLET | ORAL | 0 refills | Status: AC | PRN

## 2022-05-23 MED ORDER — HYDROMORPHONE HCL 2 MG/ML IJ SOLN
INTRAMUSCULAR | Status: AC
  Filled 2022-05-23: qty 1

## 2022-05-23 MED ORDER — GABAPENTIN 300 MG PO CAPS
300.0000 mg | ORAL_CAPSULE | ORAL | Status: AC
  Administered 2022-05-23: 300 mg via ORAL

## 2022-05-23 MED ORDER — DEXMEDETOMIDINE HCL IN NACL 80 MCG/20ML IV SOLN
INTRAVENOUS | Status: AC
  Filled 2022-05-23: qty 20

## 2022-05-23 MED ORDER — IBUPROFEN 600 MG PO TABS: 600.0000 mg | ORAL_TABLET | Freq: Four times a day (QID) | ORAL | 1 refills | Status: AC | PRN

## 2022-05-23 MED ORDER — ONDANSETRON HCL 4 MG/2ML IJ SOLN
INTRAMUSCULAR | Status: DC | PRN
  Administered 2022-05-23: 4 mg via INTRAVENOUS

## 2022-05-23 MED ORDER — ALUM & MAG HYDROXIDE-SIMETH 200-200-20 MG/5ML PO SUSP: 30.0000 mL | ORAL | Status: DC | PRN

## 2022-05-23 MED ORDER — SIMETHICONE 80 MG PO CHEW
CHEWABLE_TABLET | ORAL | Status: AC
  Filled 2022-05-23: qty 1

## 2022-05-23 MED ORDER — IBUPROFEN 200 MG PO TABS
600.0000 mg | ORAL_TABLET | Freq: Four times a day (QID) | ORAL | Status: DC
  Administered 2022-05-23: 600 mg via ORAL

## 2022-05-23 MED ORDER — ROCURONIUM BROMIDE 10 MG/ML (PF) SYRINGE
PREFILLED_SYRINGE | INTRAVENOUS | Status: DC | PRN
  Administered 2022-05-23: 60 mg via INTRAVENOUS
  Administered 2022-05-23 (×2): 20 mg via INTRAVENOUS

## 2022-05-23 MED ORDER — HYDROMORPHONE HCL 1 MG/ML IJ SOLN
INTRAMUSCULAR | Status: DC | PRN
  Administered 2022-05-23: .5 mg via INTRAVENOUS

## 2022-05-23 MED ORDER — ZOLPIDEM TARTRATE 5 MG PO TABS: 5.0000 mg | ORAL_TABLET | Freq: Every evening | ORAL | Status: DC | PRN

## 2022-05-23 MED ORDER — DEXMEDETOMIDINE HCL IN NACL 80 MCG/20ML IV SOLN
INTRAVENOUS | Status: DC | PRN
  Administered 2022-05-23: 12 ug via BUCCAL

## 2022-05-23 MED ORDER — SUGAMMADEX SODIUM 200 MG/2ML IV SOLN
INTRAVENOUS | Status: DC | PRN
  Administered 2022-05-23: 200 mg via INTRAVENOUS

## 2022-05-23 MED ORDER — PANTOPRAZOLE SODIUM 40 MG PO TBEC
DELAYED_RELEASE_TABLET | ORAL | Status: AC
  Filled 2022-05-23: qty 1

## 2022-05-23 MED ORDER — SIMETHICONE 80 MG PO CHEW
80.0000 mg | CHEWABLE_TABLET | Freq: Four times a day (QID) | ORAL | Status: DC | PRN
  Administered 2022-05-23: 80 mg via ORAL

## 2022-05-23 MED ORDER — HYDROMORPHONE HCL 2 MG PO TABS
2.0000 mg | ORAL_TABLET | ORAL | Status: DC | PRN
  Administered 2022-05-23 (×2): 2 mg via ORAL

## 2022-05-23 MED ORDER — SENNA 8.6 MG PO TABS: 1.0000 | ORAL_TABLET | Freq: Two times a day (BID) | ORAL | Status: DC

## 2022-05-23 MED ORDER — KETOROLAC TROMETHAMINE 30 MG/ML IJ SOLN
INTRAMUSCULAR | Status: DC | PRN
  Administered 2022-05-23: 30 mg via INTRAVENOUS

## 2022-05-23 MED ORDER — LIDOCAINE 2% (20 MG/ML) 5 ML SYRINGE
INTRAMUSCULAR | Status: DC | PRN
  Administered 2022-05-23: 60 mg via INTRAVENOUS

## 2022-05-23 MED ORDER — ONDANSETRON HCL 4 MG/2ML IJ SOLN: 4.0000 mg | Freq: Four times a day (QID) | INTRAMUSCULAR | Status: DC | PRN

## 2022-05-23 MED ORDER — MENTHOL 3 MG MT LOZG: 1.0000 | LOZENGE | OROMUCOSAL | Status: DC | PRN

## 2022-05-23 SURGICAL SUPPLY — 66 items
ADH SKN CLS APL DERMABOND .7 (GAUZE/BANDAGES/DRESSINGS) ×1
APL SRG 38 LTWT LNG FL B (MISCELLANEOUS) ×1
APPLICATOR ARISTA FLEXITIP XL (MISCELLANEOUS) IMPLANT
BAG DECANTER FOR FLEXI CONT (MISCELLANEOUS) IMPLANT
COVER BACK TABLE 60X90IN (DRAPES) ×1 IMPLANT
COVER TIP SHEARS 8 DVNC (MISCELLANEOUS) ×1 IMPLANT
COVER TIP SHEARS 8MM DA VINCI (MISCELLANEOUS) ×1
DEFOGGER SCOPE WARMER CLEARIFY (MISCELLANEOUS) ×1 IMPLANT
DERMABOND ADVANCED .7 DNX12 (GAUZE/BANDAGES/DRESSINGS) ×1 IMPLANT
DILATOR CANAL MILEX (MISCELLANEOUS) ×1 IMPLANT
DRAPE ARM DVNC X/XI (DISPOSABLE) ×4 IMPLANT
DRAPE COLUMN DVNC XI (DISPOSABLE) ×1 IMPLANT
DRAPE DA VINCI XI ARM (DISPOSABLE) ×4
DRAPE DA VINCI XI COLUMN (DISPOSABLE) ×1
DRAPE UTILITY 15X26 TOWEL STRL (DRAPES) ×1 IMPLANT
DRSG TEGADERM 8X12 (GAUZE/BANDAGES/DRESSINGS) IMPLANT
DURAPREP 26ML APPLICATOR (WOUND CARE) ×1 IMPLANT
ELECT REM PT RETURN 9FT ADLT (ELECTROSURGICAL) ×1
ELECTRODE REM PT RTRN 9FT ADLT (ELECTROSURGICAL) ×1 IMPLANT
GAUZE 4X4 16PLY ~~LOC~~+RFID DBL (SPONGE) ×2 IMPLANT
GLOVE BIOGEL PI IND STRL 6.5 (GLOVE) ×6 IMPLANT
GLOVE BIOGEL PI IND STRL 7.0 (GLOVE) ×6 IMPLANT
GLOVE SURG SS PI 6.5 STRL IVOR (GLOVE) IMPLANT
GLOVE SURG SS PI 7.5 STRL IVOR (GLOVE) IMPLANT
GOWN STRL REUS W/TWL LRG LVL3 (GOWN DISPOSABLE) IMPLANT
GYRUS RUMI II 2.5CM BLUE (DISPOSABLE) ×1
HEMOSTAT ARISTA ABSORB 3G PWDR (HEMOSTASIS) IMPLANT
HIBICLENS CHG 4% 4OZ (MISCELLANEOUS) IMPLANT
HOLDER FOLEY CATH W/STRAP (MISCELLANEOUS) IMPLANT
IRRIG SUCT STRYKERFLOW 2 WTIP (MISCELLANEOUS) ×1
IRRIGATION SUCT STRKRFLW 2 WTP (MISCELLANEOUS) ×1 IMPLANT
KIT TURNOVER CYSTO (KITS) ×1 IMPLANT
LEGGING LITHOTOMY PAIR STRL (DRAPES) ×1 IMPLANT
MANIFOLD NEPTUNE II (INSTRUMENTS) ×1 IMPLANT
OBTURATOR OPTICAL STANDARD 8MM (TROCAR) ×1
OBTURATOR OPTICAL STND 8 DVNC (TROCAR) ×1
OBTURATOR OPTICALSTD 8 DVNC (TROCAR) ×1 IMPLANT
OCCLUDER COLPOPNEUMO (BALLOONS) ×1 IMPLANT
PACK ROBOT WH (CUSTOM PROCEDURE TRAY) ×1 IMPLANT
PACK ROBOTIC GOWN (GOWN DISPOSABLE) ×1 IMPLANT
PACK TRENDGUARD 450 HYBRID PRO (MISCELLANEOUS) IMPLANT
PAD OB MATERNITY 4.3X12.25 (PERSONAL CARE ITEMS) ×1 IMPLANT
PAD PREP 24X48 CUFFED NSTRL (MISCELLANEOUS) ×1 IMPLANT
PROTECTOR NERVE ULNAR (MISCELLANEOUS) ×1 IMPLANT
RTRCTR WOUND ALEXIS 18CM SML (INSTRUMENTS) ×1
RUMI II GYRUS 2.5CM BLUE (DISPOSABLE) IMPLANT
SAVER CELL AAL HAEMONETICS (INSTRUMENTS) IMPLANT
SCISSORS LAP 5X45 EPIX DISP (ENDOMECHANICALS) IMPLANT
SEAL CANN UNIV 5-8 DVNC XI (MISCELLANEOUS) ×4 IMPLANT
SEAL XI 5MM-8MM UNIVERSAL (MISCELLANEOUS) ×4
SEALER VESSEL DA VINCI XI (MISCELLANEOUS) ×1
SEALER VESSEL EXT DVNC XI (MISCELLANEOUS) IMPLANT
SET IRRIG Y TYPE TUR BLADDER L (SET/KITS/TRAYS/PACK) IMPLANT
SET TRI-LUMEN FLTR TB AIRSEAL (TUBING) ×1 IMPLANT
SPIKE FLUID TRANSFER (MISCELLANEOUS) ×2 IMPLANT
SPONGE T-LAP 4X18 ~~LOC~~+RFID (SPONGE) ×1 IMPLANT
SUT VIC AB 0 CT1 27 (SUTURE) ×2
SUT VIC AB 0 CT1 27XBRD ANBCTR (SUTURE) ×2 IMPLANT
SUT VICRYL RAPIDE 4/0 PS 2 (SUTURE) ×3 IMPLANT
SUT VLOC 180 0 9IN  GS21 (SUTURE) ×1
SUT VLOC 180 0 9IN GS21 (SUTURE) ×1 IMPLANT
TIP UTERINE 6.7X10CM GRN DISP (MISCELLANEOUS) IMPLANT
TOWEL OR 17X26 10 PK STRL BLUE (TOWEL DISPOSABLE) ×1 IMPLANT
TRAY FOLEY W/BAG SLVR 14FR LF (SET/KITS/TRAYS/PACK) IMPLANT
TRENDGUARD 450 HYBRID PRO PACK (MISCELLANEOUS) ×1
TROCAR PORT AIRSEAL 8X120 (TROCAR) ×1 IMPLANT

## 2022-05-23 NOTE — Anesthesia Procedure Notes (Signed)
Procedure Name: Intubation Date/Time: 05/23/2022 11:00 AM  Performed by: Rogers Blocker, CRNAPre-anesthesia Checklist: Patient identified, Emergency Drugs available, Suction available and Patient being monitored Patient Re-evaluated:Patient Re-evaluated prior to induction Oxygen Delivery Method: Circle System Utilized Preoxygenation: Pre-oxygenation with 100% oxygen Induction Type: IV induction Ventilation: Mask ventilation without difficulty Laryngoscope Size: Mac and 3 Grade View: Grade I Tube type: Oral Tube size: 7.0 mm Number of attempts: 1 Airway Equipment and Method: Stylet and Bite block Placement Confirmation: ETT inserted through vocal cords under direct vision, positive ETCO2 and breath sounds checked- equal and bilateral Secured at: 22 cm Tube secured with: Tape Dental Injury: Teeth and Oropharynx as per pre-operative assessment

## 2022-05-23 NOTE — Progress Notes (Signed)
Pt has documented and verified hydrocodone allergy, has oxycodone ordered for breakthrough pain. Discussed with pharmacist, pharmacist recommended different medication be ordered. MD notified, orders changed to dilaudid 2 mg PO q4 prn pain.  Lyndel Pleasure, RN

## 2022-05-23 NOTE — Discharge Instructions (Addendum)

## 2022-05-23 NOTE — Discharge Summary (Signed)
Physician Discharge Summary  Patient ID: Whitney Francis MRN: [id number] DOB/AGE: January 15, 1971 51 y.o.  Admit date: 05/23/2022 Discharge date: 05/23/2022  Admission Diagnoses:  Discharge Diagnoses:  Principal Problem:   Menorrhagia   Discharged Condition: {condition:18240}  Hospital Course: ***  Consults: {consultation:18241}  Significant Diagnostic Studies: {diagnostics:18242}  Treatments: {Tx:18249}  Discharge Exam: Blood pressure 126/73, pulse 70, temperature 98.5 F (36.9 C), resp. rate 15, height '5\' 9"'$  (1.753 m), weight 133.6 kg, last menstrual period 01/17/2022, SpO2 98 %. {physical BSWH:6759163}  Disposition: Discharge disposition: 01-Home or Self Care       Discharge Instructions     Call MD for:  persistant nausea and vomiting   Complete by: As directed    Call MD for:  redness, tenderness, or signs of infection (pain, swelling, redness, odor or green/yellow discharge around incision site)   Complete by: As directed    Call MD for:  severe uncontrolled pain   Complete by: As directed    Call MD for:  temperature >100.4   Complete by: As directed    Diet general   Complete by: As directed    Driving Restrictions   Complete by: As directed    Avoid driving for 1 week   Increase activity slowly   Complete by: As directed    Lifting restrictions   Complete by: As directed    Avoid lifting over 10 lbs   May shower / Bathe   Complete by: As directed    May walk up steps   Complete by: As directed    No wound care   Complete by: As directed    Sexual Activity Restrictions   Complete by: As directed    Avoid sex for 6 weeks and until approved by Dr. Landry Mellow      Allergies as of 05/23/2022       Reactions   Decongest-aid [pseudoephedrine] Rash   Hydrocodone Bitart (antituss) [hydrocodone] Rash   Opoid related, full body rash   Latex Rash   Sulfa Antibiotics Rash        Medication List     STOP taking these medications    MYFEMBREE PO        TAKE these medications    acetaminophen 500 MG tablet Commonly known as: TYLENOL Take 2 tablets (1,000 mg total) by mouth every 8 (eight) hours as needed.   HYDROmorphone 2 MG tablet Commonly known as: DILAUDID Take 1 tablet (2 mg total) by mouth every 4 (four) hours as needed for severe pain.   ibuprofen 600 MG tablet Commonly known as: ADVIL Take 1 tablet (600 mg total) by mouth every 6 (six) hours as needed.   Vitamin D 50 MCG (2000 UT) Caps Take by mouth.        Follow-up Information     Christophe Louis, MD. Go in 2 week(s).   Specialty: Obstetrics and Gynecology Contact information: [address] [place] [address] [phone]                 Signed: Christophe Louis 05/23/2022, 4:41 PM

## 2022-05-23 NOTE — Op Note (Signed)
Procedure: The patient was taken to the operating room where she was placed under general anesthesia.Time out was performed. Marland Kitchen She was placed in dorsal lithotomy position and prepped and draped in the usual sterile fashion. A weighted speculum was placed into the vagina. A Deaver was placed anteriorly for retraction. The anterior lip of the cervix was grasped with a single-tooth tenaculum. The vaginal mucosa was injected with 2.5 cc of ropivacaine at the 2/4/ 8 and 10 o'clock positions. The uterus was sounded to 8 cm. the cervix was dilated to 6 mm . 0 vicryl suture placed at the 12 and 6:00 positions Of the cervix to facilitate placement of a Ru mi uterine manipulator. The manipulator was placed without difficulty. Weighted speculum and Deaver were removed .  Attention was turned to the patient's abdomen where a 8 mm trocar was placed 2 cm above the umbilicus. under direct visualization . The pneumoperitoneum was achieved with PCO2 gas. The laparoscope was removed. 60 cc of ropivacaine were injected into the abdominal cavity. The laparoscope was reinserted. An 8 mm trocar was placed in the right upper quadrant 16 centimeters from the umbilicus.later connected to robotic arm #4). An 8MM incision was made in the Right upper quadrant TROCAR WAS PLACED 8 cm from the umbilicus. Later connected to robotic arm #3. An 8 mm incision was made in the left upper quadrant 16 cm from the umbilicus and connected to robot arm #1. Marland Kitchen Attention was turned to the left upper quadrant where a 8 mm midclavicular assistant trocar was placed. ( All incision sites were injected with 10cc of ropivacaine prior to port placement. )  Once all ports had been placed under direct visualization.The laparoscope was removed and the Sparks robotic system was thin right-sided docked. The robotic arms were connected to the corresponding trocars as listed above. The laparoscope was then reinserted. The long tip bipolar forceps were placed into port  #1. The monopolar scissor placed in the port #4. A vessel sealerwas placed in port #3. All instruments were directed into the pelvis under direct visualization.  Attention was turned to the surgeons console.. The left mesosalpinx and left utero-ovarian ligament was cauterized and transected with the vessel sealer The broad ligament was cauterized and transected with the vessel sealer .The round ligament was cauterized and transected with the vessel sealer  The anterior leaf of broad ligament was incised along the bladder reflection to the midline.  The right  mesosalpinx and right utero-ovarian ligament was cauterized and transected with the vessel sealer. The right broad ligament was cauterized and transected with the vessel sealer. The right round ligament was cauterized and transected with the vessel sealer The broad ligament was incised to the midline. The bladder was dissected off the lower uterine segments of the cervix via sharp and blunt dissection.   The uterine arteries were skeleton bilaterally. They were  cauterized and transected with the vessel sealer The KOH ring was identified. The anterior colpotomy was performed followed by the posterior colpotomy. Once the uterus,cervix and bilateral fallopian tubes were completely excised was removed through the vagina. The  bipolar forceps and scissors were removed and log tip forceps were placed in the port #1 and the cutting needle driver was placed in to port #3.  The vaginal cuff was closed with running suture if 0 v-lock. The pelvis was irrigated. Marland KitchenMarland KitchenMarland KitchenExcellent hemostasis was noted. Arista was placed along the vaginal cuff.  All pelvic pedicles were examined and hemostasis was noted.  All instruments  removed from the ports. All ports were removed under direct Visualization. The pneumoperitoneum was released. The skin incisions were closed with 4-0 Vicryl and then covered with Derma bond.     Sponge lap and needle counts weIre correct x. The patient  was awakened from anesthesia and taken to the recovery room in stable condition. 05/23/2022  2:07 PM  PATIENT:  Whitney Francis  51 y.o. female  PRE-OPERATIVE DIAGNOSIS:  Menorrhagia with Irregular Cycles Fibroids  POST-OPERATIVE DIAGNOSIS:  Menorrhagia with Irregular CyclesFibroids  PROCEDURE:  Procedure(s): XI ROBOTIC ASSISTED LAPAROSCOPIC HYSTERECTOMY AND BILATERAL  SALPINGO-OOPHORECTOMY (Bilateral)  SURGEON:  Surgeon(s) and Role:    Christophe Louis, MD - Primary    Delora Fuel, Lenna Sciara, DO - Assisting  PHYSICIAN ASSISTANT:   ASSISTANTS: {ASSISTANTS:31801}   ANESTHESIA:   {procedures; anesthesia:812}  EBL:  {None/Minimal: 21241}   BLOOD ADMINISTERED:{BLOOD GIVEN TYPES AND AMOUNTS:20467}  DRAINS: {Devices; drains:31758}   LOCAL MEDICATIONS USED:  {LOCAL MEDICATIONS:10721995}  SPECIMEN:  {ONC STAGING; AJCC TYPE OF SPECIMEN:115600001}  DISPOSITION OF SPECIMEN:  {SPECIMEN DISPOSITION:204680}  COUNTS:  {OR COUNTS CORRECT/INCORRECT:204690}  TOURNIQUET:  * No tourniquets in log *  DICTATION: .{DICTATION MCRFV:4360677034}  PLAN OF CARE: {OPTIME PLAN OF KBTC:4818590931}  PATIENT DISPOSITION:  {op note disposition:31782}   Delay start of Pharmacological VTE agent (>24hrs) due to surgical blood loss or risk of bleeding: {YES/NO/NOT APPLICABLE:20182}

## 2022-05-23 NOTE — Anesthesia Postprocedure Evaluation (Signed)
Anesthesia Post Note  Patient: Whitney Francis  Procedure(s) Performed: XI ROBOTIC ASSISTED LAPAROSCOPIC HYSTERECTOMY AND BILATERAL  SALPINGO-OOPHORECTOMY (Bilateral: Abdomen)     Patient location during evaluation: PACU Anesthesia Type: General Level of consciousness: awake and alert Pain management: pain level controlled Vital Signs Assessment: post-procedure vital signs reviewed and stable Respiratory status: spontaneous breathing, nonlabored ventilation, respiratory function stable and patient connected to nasal cannula oxygen Cardiovascular status: blood pressure returned to baseline and stable Postop Assessment: no apparent nausea or vomiting Anesthetic complications: no   No notable events documented.  Last Vitals:  Vitals:   05/23/22 1445 05/23/22 1500  BP: 125/82 132/87  Pulse: 60 68  Resp: 12 15  Temp:  36.6 C  SpO2: 99% 98%    Last Pain:  Vitals:   05/23/22 1430  TempSrc:   PainSc: 0-No pain                 Belenda Cruise P Andrienne Havener

## 2022-05-23 NOTE — Transfer of Care (Signed)
Immediate Anesthesia Transfer of Care Note  Patient: Whitney Francis  Procedure(s) Performed: XI ROBOTIC ASSISTED LAPAROSCOPIC HYSTERECTOMY AND BILATERAL  SALPINGO-OOPHORECTOMY (Bilateral: Abdomen)  Patient Location: PACU  Anesthesia Type:General  Level of Consciousness: drowsy, patient cooperative, and responds to stimulation  Airway & Oxygen Therapy: Patient Spontanous Breathing  Post-op Assessment: Report given to RN and Post -op Vital signs reviewed and stable  Post vital signs: Reviewed and stable  Last Vitals:  Vitals Value Taken Time  BP 127/75 05/23/22 1405  Temp 36.7 C 05/23/22 1405  Pulse 70 05/23/22 1409  Resp 11 05/23/22 1409  SpO2 97 % 05/23/22 1409  Vitals shown include unvalidated device data.  Last Pain:  Vitals:   05/23/22 0839  TempSrc: Oral         Complications: No notable events documented.

## 2022-05-23 NOTE — Anesthesia Preprocedure Evaluation (Signed)
Anesthesia Evaluation  Patient identified by MRN, date of birth, ID band Patient awake    Reviewed: Allergy & Precautions, NPO status   Airway Mallampati: II  TM Distance: >3 FB Neck ROM: Full    Dental no notable dental hx.    Pulmonary neg pulmonary ROS   Pulmonary exam normal        Cardiovascular negative cardio ROS  Rhythm:Regular Rate:Normal     Neuro/Psych negative neurological ROS  negative psych ROS   GI/Hepatic negative GI ROS, Neg liver ROS,,,  Endo/Other  negative endocrine ROS    Renal/GU negative Renal ROS  Female GU complaint Fibroids     Musculoskeletal  (+) Arthritis , Osteoarthritis,    Abdominal Normal abdominal exam  (+)   Peds  Hematology   Anesthesia Other Findings   Reproductive/Obstetrics                             Anesthesia Physical Anesthesia Plan  ASA: 2  Anesthesia Plan: General   Post-op Pain Management: Tylenol PO (pre-op)*   Induction: Intravenous  PONV Risk Score and Plan: 3 and Ondansetron, Dexamethasone, Midazolam and Treatment may vary due to age or medical condition  Airway Management Planned: Mask and Oral ETT  Additional Equipment: None  Intra-op Plan:   Post-operative Plan: Extubation in OR  Informed Consent: I have reviewed the patients History and Physical, chart, labs and discussed the procedure including the risks, benefits and alternatives for the proposed anesthesia with the patient or authorized representative who has indicated his/her understanding and acceptance.     Dental advisory given  Plan Discussed with: CRNA  Anesthesia Plan Comments:        Anesthesia Quick Evaluation

## 2022-05-23 NOTE — H&P (Signed)
Date of Initial H&P: 05/22/2022  History reviewed, patient examined, no change in status, stable for surgery.

## 2022-05-24 ENCOUNTER — Encounter (HOSPITAL_BASED_OUTPATIENT_CLINIC_OR_DEPARTMENT_OTHER): Payer: Self-pay | Admitting: Obstetrics and Gynecology

## 2022-05-24 LAB — SURGICAL PATHOLOGY

## 2022-06-09 DIAGNOSIS — E559 Vitamin D deficiency, unspecified: Secondary | ICD-10-CM | POA: Diagnosis not present

## 2022-06-09 DIAGNOSIS — Z13220 Encounter for screening for lipoid disorders: Secondary | ICD-10-CM | POA: Diagnosis not present

## 2022-06-09 DIAGNOSIS — R7303 Prediabetes: Secondary | ICD-10-CM | POA: Diagnosis not present

## 2022-06-09 DIAGNOSIS — Z130 Encounter for screening for diseases of the blood and blood-forming organs and certain disorders involving the immune mechanism: Secondary | ICD-10-CM | POA: Diagnosis not present

## 2022-06-09 DIAGNOSIS — Z0000 Encounter for general adult medical examination without abnormal findings: Secondary | ICD-10-CM | POA: Diagnosis not present

## 2022-07-05 DIAGNOSIS — Z4889 Encounter for other specified surgical aftercare: Secondary | ICD-10-CM | POA: Diagnosis not present

## 2022-09-20 DIAGNOSIS — M25562 Pain in left knee: Secondary | ICD-10-CM | POA: Diagnosis not present

## 2022-11-22 DIAGNOSIS — M25562 Pain in left knee: Secondary | ICD-10-CM | POA: Diagnosis not present

## 2022-11-29 DIAGNOSIS — Z6841 Body Mass Index (BMI) 40.0 and over, adult: Secondary | ICD-10-CM | POA: Diagnosis not present

## 2022-11-29 DIAGNOSIS — Z1231 Encounter for screening mammogram for malignant neoplasm of breast: Secondary | ICD-10-CM | POA: Diagnosis not present

## 2022-11-29 DIAGNOSIS — E559 Vitamin D deficiency, unspecified: Secondary | ICD-10-CM | POA: Diagnosis not present

## 2022-11-29 DIAGNOSIS — R8290 Unspecified abnormal findings in urine: Secondary | ICD-10-CM | POA: Diagnosis not present

## 2022-12-06 DIAGNOSIS — E782 Mixed hyperlipidemia: Secondary | ICD-10-CM | POA: Diagnosis not present

## 2022-12-06 DIAGNOSIS — E559 Vitamin D deficiency, unspecified: Secondary | ICD-10-CM | POA: Diagnosis not present

## 2022-12-06 DIAGNOSIS — R7303 Prediabetes: Secondary | ICD-10-CM | POA: Diagnosis not present

## 2022-12-20 ENCOUNTER — Ambulatory Visit
Admission: RE | Admit: 2022-12-20 | Discharge: 2022-12-20 | Disposition: A | Discharge status: Home / Self Care | Payer: BC Managed Care – PPO | Source: Ambulatory Visit | Attending: Nurse Practitioner | Admitting: Nurse Practitioner

## 2022-12-20 ENCOUNTER — Other Ambulatory Visit: Payer: Self-pay | Admitting: Nurse Practitioner

## 2022-12-20 DIAGNOSIS — M25562 Pain in left knee: Secondary | ICD-10-CM | POA: Diagnosis not present

## 2022-12-20 DIAGNOSIS — Z1231 Encounter for screening mammogram for malignant neoplasm of breast: Secondary | ICD-10-CM

## 2022-12-20 DIAGNOSIS — Z01419 Encounter for gynecological examination (general) (routine) without abnormal findings: Secondary | ICD-10-CM | POA: Diagnosis not present

## 2023-01-03 DIAGNOSIS — E349 Endocrine disorder, unspecified: Secondary | ICD-10-CM | POA: Diagnosis not present

## 2023-01-03 DIAGNOSIS — Z8262 Family history of osteoporosis: Secondary | ICD-10-CM | POA: Diagnosis not present

## 2023-01-18 ENCOUNTER — Encounter (HOSPITAL_COMMUNITY): Payer: Self-pay | Admitting: Emergency Medicine

## 2023-01-18 ENCOUNTER — Emergency Department (HOSPITAL_COMMUNITY): Payer: Worker's Compensation

## 2023-01-18 ENCOUNTER — Emergency Department (HOSPITAL_COMMUNITY)
Admission: EM | Admit: 2023-01-18 | Discharge: 2023-01-18 | Disposition: A | Discharge status: Home / Self Care | Payer: Worker's Compensation | Attending: Emergency Medicine | Admitting: Emergency Medicine

## 2023-01-18 ENCOUNTER — Other Ambulatory Visit: Payer: Self-pay

## 2023-01-18 DIAGNOSIS — Z91040 Latex allergy status: Secondary | ICD-10-CM | POA: Insufficient documentation

## 2023-01-18 DIAGNOSIS — S0990XA Unspecified injury of head, initial encounter: Secondary | ICD-10-CM | POA: Diagnosis not present

## 2023-01-18 DIAGNOSIS — W01198A Fall on same level from slipping, tripping and stumbling with subsequent striking against other object, initial encounter: Secondary | ICD-10-CM | POA: Diagnosis not present

## 2023-01-18 MED ORDER — ACETAMINOPHEN 325 MG PO TABS
650.0000 mg | ORAL_TABLET | Freq: Once | ORAL | Status: AC
  Administered 2023-01-18: 650 mg via ORAL
  Filled 2023-01-18: qty 2

## 2023-01-18 NOTE — ED Triage Notes (Signed)
Patient hit her head this AM and has had a headache since. Pain has increased in the last 1.5 hours with numbness to the left cheek and eye. Since this happened at work, her job had her come here.

## 2023-01-18 NOTE — ED Provider Notes (Signed)
[facility] Provider Note   CSN: [id number] Arrival date & time: 01/18/23  1846     History  Chief Complaint  Patient presents with   Head Injury    Whitney Francis is a 52 y.o. female with medical history of vitamin D deficiency.  Patient presents to ED for evaluation of head injury.  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  The patient reports that they were carrying a dog outside on a stretcher today when the patient fell because she tripped on her feet falling forward and striking her head on the bumper of a car.  She denies losing consciousness, denies taking blood thinners.  She reports that she has had a persistent headache since this time.  She denies neck pain, photophobia, painful EOMs, nausea, vomiting, chest pain, shortness of breath.  She states that she took some ibuprofen which did not fully alleviate her headache.   Head Injury Associated symptoms: headache   Associated symptoms: no nausea, no neck pain, no numbness, no seizures and no vomiting        Home Medications Prior to Admission medications   Medication Sig Start Date End Date Taking? Authorizing Provider  acetaminophen (TYLENOL) 500 MG tablet Take 2 tablets (1,000 mg total) by mouth every 8 (eight) hours as needed. 05/23/22   Gerald Leitz, MD  Cholecalciferol (VITAMIN D) 50 MCG (2000 UT) CAPS Take by mouth.    [provider]  HYDROmorphone (DILAUDID) 2 MG tablet Take 1 tablet (2 mg total) by mouth every 4 (four) hours as needed for severe pain. 05/23/22   Gerald Leitz, MD  ibuprofen (ADVIL) 600 MG tablet Take 1 tablet (600 mg total) by mouth every 6 (six) hours as needed. 05/23/22   Gerald Leitz, MD      Allergies    Decongest-aid [pseudoephedrine], Hydrocodone bitart (antituss) [hydrocodone], Latex, and Sulfa antibiotics    Review of Systems   Review of Systems  Gastrointestinal:  Negative for nausea and vomiting.   Musculoskeletal:  Negative for neck pain.  Neurological:  Positive for headaches. Negative for dizziness, seizures, syncope, weakness and numbness.  All other systems reviewed and are negative.   Physical Exam Updated Vital Signs BP 120/89   Pulse 84   Temp 98.6 F (37 C) (Oral)   Resp 16   SpO2 98%  Physical Exam Vitals and nursing note reviewed.  Constitutional:      General: She is not in acute distress.    Appearance: Normal appearance. She is not ill-appearing, toxic-appearing or diaphoretic.  HENT:     Head: Normocephalic and atraumatic.     Nose: Nose normal.     Mouth/Throat:     Mouth: Mucous membranes are moist.     Pharynx: Oropharynx is clear.  Eyes:     Extraocular Movements: Extraocular movements intact.     Conjunctiva/sclera: Conjunctivae normal.     Pupils: Pupils are equal, round, and reactive to light.  Cardiovascular:     Rate and Rhythm: Normal rate and regular rhythm.  Pulmonary:     Effort: Pulmonary effort is normal.     Breath sounds: Normal breath sounds. No wheezing.  Abdominal:     General: Abdomen is flat. Bowel sounds are normal.     Palpations: Abdomen is soft.     Tenderness: There is no abdominal tenderness.  Musculoskeletal:     Cervical back: Normal range of motion and neck supple. No tenderness.  Skin:    General: Skin is warm and dry.     Capillary Refill: Capillary refill takes less than 2 seconds.  Neurological:     General: No focal deficit present.     Mental Status: She is alert and oriented to person, place, and time.     GCS: GCS eye subscore is 4. GCS verbal subscore is 5. GCS motor subscore is 6.     Cranial Nerves: No cranial nerve deficit.     Sensory: Sensation is intact. No sensory deficit.     Motor: Motor function is intact. No weakness.     Coordination: Coordination is intact. Heel to [facility] Test normal.     ED Results / Procedures / Treatments   Labs (all labs ordered are listed, but only abnormal results  are displayed) Labs Reviewed - No data to display  EKG None  Radiology CT Head Wo Contrast  Result Date: 01/18/2023 CLINICAL DATA:  Head trauma, moderate-severe EXAM: CT HEAD WITHOUT CONTRAST TECHNIQUE: Contiguous axial images were obtained from the base of the skull through the vertex without intravenous contrast. RADIATION DOSE REDUCTION: This exam was performed according to the departmental dose-optimization program which includes automated exposure control, adjustment of the mA and/or kV according to patient size and/or use of iterative reconstruction technique. COMPARISON:  None Available. FINDINGS: Brain: No evidence of large-territorial acute infarction. No parenchymal hemorrhage. No mass lesion. No extra-axial collection. No mass effect or midline shift. No hydrocephalus. Basilar cisterns are patent. Vascular: No hyperdense vessel. Skull: No acute fracture or focal lesion. Sinuses/Orbits: Paranasal sinuses and mastoid air cells are clear. The orbits are unremarkable. Other: None. IMPRESSION: No acute intracranial abnormality. Electronically Signed   By: Tish Frederickson M.D.   On: 01/18/2023 20:01    Procedures Procedures   Medications Ordered in ED Medications  acetaminophen (TYLENOL) tablet 650 mg (650 mg Oral Given 01/18/23 2035)    ED Course/ Medical Decision Making/ A&P  Medical Decision Making Amount and/or Complexity of Data Reviewed Radiology: ordered.  Risk OTC drugs.   52 year old female presents to ED for evaluation.  Please see HPI for further details.  On examination, patient neurological examination reassuring.  Intact finger-nose, heel-to-shin.  5 out of 5 strength bilateral lower extremities.  5 out of 5 strength bilateral upper extremities.  No pronator drift, slurred speech, no facial droop.  EOMs are intact, nonpainful.  She has no periorbital tenderness.  She has no trauma to her face.  Has no trauma to her head that I am able to appreciate, no soft tissue  swelling.  Patient CT scan of her head is unremarkable.  Patient given Tylenol.  On reassessment, she reports that her headache has slightly decreased.  At this time, patient will be discharged home.  She will follow-up with her PCP.  She was encouraged to return to the ED with any new or worsening signs or symptoms and she voiced understanding.  Stable to discharge at this time.   Final Clinical Impression(s) / ED Diagnoses Final diagnoses:  Injury of head, initial encounter    Rx / DC Orders ED Discharge Orders     None         Clent Ridges 01/18/23 2047    Tegeler, Canary Brim, MD 01/18/23 2233

## 2023-01-18 NOTE — Discharge Instructions (Addendum)
It was a pleasure taking part in your care today.  As discussed, the CT scan of your head was reassuring.  Please continue taking Tylenol every 6 hours as needed for headache.  Please follow-up with your PCP for further management and reevaluation.  Please read the attached guide concerning head injury.

## 2023-03-27 DIAGNOSIS — L304 Erythema intertrigo: Secondary | ICD-10-CM | POA: Diagnosis not present

## 2023-03-27 DIAGNOSIS — L03316 Cellulitis of umbilicus: Secondary | ICD-10-CM | POA: Diagnosis not present

## 2023-08-13 DIAGNOSIS — M25562 Pain in left knee: Secondary | ICD-10-CM | POA: Diagnosis not present

## 2023-11-20 ENCOUNTER — Other Ambulatory Visit: Payer: Self-pay | Admitting: Nurse Practitioner

## 2023-11-20 DIAGNOSIS — Z1231 Encounter for screening mammogram for malignant neoplasm of breast: Secondary | ICD-10-CM

## 2023-12-26 ENCOUNTER — Ambulatory Visit
Admission: RE | Admit: 2023-12-26 | Discharge: 2023-12-26 | Disposition: A | Discharge status: Home / Self Care | Source: Ambulatory Visit | Attending: Nurse Practitioner | Admitting: Nurse Practitioner

## 2023-12-26 DIAGNOSIS — Z1231 Encounter for screening mammogram for malignant neoplasm of breast: Secondary | ICD-10-CM

## 2023-12-27 ENCOUNTER — Other Ambulatory Visit: Payer: Self-pay | Admitting: Nurse Practitioner

## 2023-12-27 DIAGNOSIS — N644 Mastodynia: Secondary | ICD-10-CM

## 2024-01-09 ENCOUNTER — Encounter

## 2024-01-09 ENCOUNTER — Other Ambulatory Visit

## 2024-01-30 ENCOUNTER — Ambulatory Visit
Admission: RE | Admit: 2024-01-30 | Discharge: 2024-01-30 | Disposition: A | Discharge status: Home / Self Care | Source: Ambulatory Visit | Attending: Nurse Practitioner | Admitting: Nurse Practitioner

## 2024-01-30 ENCOUNTER — Ambulatory Visit

## 2024-01-30 DIAGNOSIS — N644 Mastodynia: Secondary | ICD-10-CM
